# Patient Record
Sex: Male | Born: 1995 | Race: Black or African American | Hispanic: No | Marital: Single | Smoking: Current every day smoker
Health system: Southern US, Community
[De-identification: ages and names within clinical notes are randomized; demographics above are authoritative.]

---

## 2018-04-14 ENCOUNTER — Other Ambulatory Visit: Payer: Self-pay

## 2018-04-14 ENCOUNTER — Other Ambulatory Visit (HOSPITAL_COMMUNITY)
Admission: RE | Admit: 2018-04-14 | Discharge: 2018-04-14 | Disposition: A | Discharge status: Home / Self Care | Payer: PRIVATE HEALTH INSURANCE | Source: Ambulatory Visit | Attending: Family Medicine | Admitting: Family Medicine

## 2018-04-14 ENCOUNTER — Emergency Department (INDEPENDENT_AMBULATORY_CARE_PROVIDER_SITE_OTHER)
Admission: EM | Admit: 2018-04-14 | Discharge: 2018-04-14 | Disposition: A | Discharge status: Home / Self Care | Payer: PRIVATE HEALTH INSURANCE | Source: Home / Self Care

## 2018-04-14 ENCOUNTER — Encounter: Payer: Self-pay | Admitting: Emergency Medicine

## 2018-04-14 DIAGNOSIS — L980 Pyogenic granuloma: Secondary | ICD-10-CM | POA: Diagnosis not present

## 2018-04-14 DIAGNOSIS — J069 Acute upper respiratory infection, unspecified: Secondary | ICD-10-CM | POA: Diagnosis present

## 2018-04-14 DIAGNOSIS — L918 Other hypertrophic disorders of the skin: Secondary | ICD-10-CM

## 2018-04-14 DIAGNOSIS — Z87891 Personal history of nicotine dependence: Secondary | ICD-10-CM | POA: Insufficient documentation

## 2018-04-14 MED ORDER — DOXYCYCLINE HYCLATE 100 MG PO CAPS: 100.0000 mg | ORAL_CAPSULE | Freq: Two times a day (BID) | ORAL | 0 refills | Status: DC

## 2018-04-14 NOTE — Discharge Instructions (Signed)
Stay off work through tomorrow.  After tomorrow you can take the Band-Aid off and gently wash it daily.  This will stay a little inflamed and irritated for a few days, but if it is getting worse at any time please return for a recheck.  Take the doxycycline antibiotic 1 twice daily for possible infection.  Take caution because doxycycline can make you sunburn easier, so I would stay out of direct sun for long periods of time for the next week.  Return at any time if concerns that it is coming back or getting more infected or inflamed.  We have sent the specimen for pathology and will let you know the results of that.  Should you not hear from [place] within 2 weeks call back to check on the  report.

## 2018-04-14 NOTE — ED Triage Notes (Signed)
Skin problem on lower right testicle x a few months, red, painful, pain increases with friction from walking.

## 2018-04-14 NOTE — ED Provider Notes (Signed)
[facility]    CSN: [id number] Arrival date & time: 04/14/18  0917     History   Chief Complaint Chief Complaint  Patient presents with  . Skin Problem    HPI Mike Fox is a 22 y.o. male.  Over the past month the patient has noted a skin tag on the right side of his scrotum.  Over the last week or 2 it is gotten inflamed and swollen and painful.  His girlfriend actually first noticed it.  He is generally healthy and no other problems or lesions.  Also complains of an upper respiratory infection and did not feel like going to work because of it today so he came on in here to get both things taken care of. HPI  History reviewed. No pertinent past medical history.  There are no active problems to display for this patient.   History reviewed. No pertinent surgical history.     Home Medications    Prior to Admission medications   Medication Sig Start Date End Date Taking? Authorizing Provider  doxycycline (VIBRAMYCIN) 100 MG capsule Take 1 capsule (100 mg total) by mouth 2 (two) times daily. 04/14/18   Peyton Najjar, MD    Family History No family history on file.  Social History Social History   Tobacco Use  . Smoking status: Former Games developer  . Smokeless tobacco: Never Used  Substance Use Topics  . Alcohol use: Not Currently  . Drug use: Not Currently     Allergies   Patient has no known allergies.   Review of Systems Review of Systems Constitutional: Unremarkable HEENT: Has a URI, congestion, slight sore throat, slight cough.  No nodes. Respiratory: Slight cough Cardiovascular: Unremarkable Dermatologic: Skin tag as noted above  Physical Exam Triage Vital Signs ED Triage Vitals  Enc Vitals Group     BP 04/14/18 0944 121/81     Pulse Rate 04/14/18 0944 67     Resp --      Temp 04/14/18 0944 98.5 F (36.9 C)     Temp Source 04/14/18 0944 Oral     SpO2 04/14/18 0944 99 %     Weight 04/14/18 0946 269 lb (122 kg)     Height  04/14/18 0946  (1.905 m)     Head Circumference --      Peak Flow --      Pain Score 04/14/18 0945 5     Pain Loc --      Pain Edu? --      Excl. in GC? --    No data found.  Updated Vital Signs BP 121/81 (BP Location: Right Arm)   Pulse 67   Temp 98.5 F (36.9 C) (Oral)   Ht  (1.905 m)   Wt 269 lb (122 kg)   SpO2 99%   BMI 33.62 kg/m   Visual Acuity Right Eye Distance:   Left Eye Distance:   Bilateral Distance:    Right Eye Near:   Left Eye Near:    Bilateral Near:     Physical Exam  Alert and oriented. TMs normal except for dry skin.   Throat clear.  Neck supple without nodes.  Nose is runny. Chest clear to auscultation.  Heart regular without murmur. Inflamed swollen skin tag on the right side of the scrotum which is very tender, almost 1 cm long, 3 mm in diameter.  No redness or inflammation around the base. UC Treatments / Results  Labs (all labs ordered are  listed, but only abnormal results are displayed) Labs Reviewed - No data to display  EKG None  Radiology No results found.  Procedures Procedures (including critical care time) The lesion was numbed with 1% lidocaine with epinephrine after routine prep.  The inflamed swollen skin tag was clipped off and sent for pathology.  Silver nitrate was used to cauterize the base.  Patient tolerated procedure well. Medications Ordered in UC Medications - No data to display  Initial Impression / Assessment and Plan / UC Course  I have reviewed the triage vital signs and the nursing notes.  Pertinent labs & imaging results that were available during my care of the patient were reviewed by me and considered in my medical decision making (see chart for details).     Inflamed skin tag versus pyogenic granuloma or possible keratoacanthoma. Final Clinical Impressions(s) / UC Diagnoses   Final diagnoses:  Pyogenic granuloma  Skin tag of perineum in male  Acute upper respiratory infection      Discharge Instructions     Stay off work through tomorrow.  After tomorrow you can take the Band-Aid off and gently wash it daily.  This will stay a little inflamed and irritated for a few days, but if it is getting worse at any time please return for a recheck.  Take the doxycycline antibiotic 1 twice daily for possible infection.  Take caution because doxycycline can make you sunburn easier, so I would stay out of direct sun for long periods of time for the next week.  Return at any time if concerns that it is coming back or getting more infected or inflamed.  We have sent the specimen for pathology and will let you know the results of that.  Should you not hear from [place] within 2 weeks call back to check on the  report.    ED Prescriptions    Medication Sig Dispense Auth. Provider   doxycycline (VIBRAMYCIN) 100 MG capsule Take 1 capsule (100 mg total) by mouth 2 (two) times daily. 14 capsule Peyton Najjar, MD     Controlled Substance Prescriptions [place] Controlled Substance Registry consulted? No   Peyton Najjar, MD 04/14/18 1014

## 2018-05-26 ENCOUNTER — Other Ambulatory Visit: Payer: Self-pay

## 2018-05-26 ENCOUNTER — Emergency Department (INDEPENDENT_AMBULATORY_CARE_PROVIDER_SITE_OTHER)
Admission: EM | Admit: 2018-05-26 | Discharge: 2018-05-26 | Disposition: A | Discharge status: Home / Self Care | Payer: PRIVATE HEALTH INSURANCE | Source: Home / Self Care | Attending: Family Medicine | Admitting: Family Medicine

## 2018-05-26 DIAGNOSIS — K122 Cellulitis and abscess of mouth: Secondary | ICD-10-CM

## 2018-05-26 DIAGNOSIS — J029 Acute pharyngitis, unspecified: Secondary | ICD-10-CM | POA: Diagnosis not present

## 2018-05-26 LAB — POCT RAPID STREP A (OFFICE): Rapid Strep A Screen: NEGATIVE

## 2018-05-26 MED ORDER — PREDNISONE 20 MG PO TABS: ORAL_TABLET | ORAL | 0 refills | Status: DC

## 2018-05-26 MED ORDER — AMOXICILLIN 875 MG PO TABS: 875.0000 mg | ORAL_TABLET | Freq: Two times a day (BID) | ORAL | 0 refills | Status: DC

## 2018-05-26 NOTE — Discharge Instructions (Addendum)
Try Flonase nasal spray daily for nasal congestion. If symptoms return after about 2 weeks, try taking Pepcid AC at bedtime.  Avoid eating meals during the 2-3 hours before bedtime. Avoid lying down right after you eat.

## 2018-05-26 NOTE — ED Triage Notes (Signed)
Has been sore off and on for about 1 week

## 2018-05-26 NOTE — ED Provider Notes (Signed)
[facility]    CSN: [id number] Arrival date & time: 05/26/18  1255     History   Chief Complaint Chief Complaint  Patient presents with  . Sore Throat    HPI Mike Fox is a 22 y.o. male.   Patient complains of two week history of sore throat and swollen uvula, worse upon awakening each morning.  He notes that he always has nasal congestion at night, causing him to breath through his mouth at night.  No fevers, chills, and sweats.  No cough except in the morning when he awakens.  He often has the sensation each morning of having phlegm in his throat.  He denies heartburn and indigestion.  He admits that he often eats late and has a snack at bedtime.  The history is provided by the patient.    History reviewed. No pertinent past medical history.  There are no active problems to display for this patient.   History reviewed. No pertinent surgical history.     Home Medications    Prior to Admission medications   Medication Sig Start Date End Date Taking? Authorizing Provider  amoxicillin (AMOXIL) 875 MG tablet Take 1 tablet (875 mg total) by mouth 2 (two) times daily. 05/26/18   Lattie Haw, MD    predniSONE (DELTASONE) 20 MG tablet Take one tab by mouth twice daily for 4 days, then one daily for 3 days. Take with food. 05/26/18   Lattie Haw, MD    Family History History reviewed. No pertinent family history.  Social History Social History   Tobacco Use  . Smoking status: Former Games developer  . Smokeless tobacco: Never Used  Substance Use Topics  . Alcohol use: Yes  . Drug use: Not Currently     Allergies   Patient has no known allergies.   Review of Systems Review of Systems + sore throat + cough (in morning) No pleuritic pain No wheezing + nasal congestion ? post-nasal drainage No sinus pain/pressure No itchy/red eyes No earache No hemoptysis No SOB No fever/chills No nausea No vomiting No abdominal pain No diarrhea No urinary symptoms No skin rash No fatigue No myalgias No headache    Physical Exam Triage Vital Signs ED Triage Vitals  Enc Vitals Group     BP 05/26/18 1310 112/71     Pulse Rate 05/26/18 1310 (!) 58     Resp --      Temp 05/26/18 1310 98.6 F (37 C)     Temp Source 05/26/18 1310 Oral     SpO2 05/26/18 1310 99 %     Weight 05/26/18 1311 266 lb (120.7 kg)     Height 05/26/18 1311 6\' 4"  (1.93 m)     Head Circumference --      Peak Flow --      Pain Score 05/26/18 1311 0     Pain Loc --      Pain Edu? --      Excl. in GC? --    No data found.  Updated Vital Signs BP 112/71 (BP Location: Right Arm)   Pulse (!) 58   Temp 98.6 F (37 C) (Oral)   Ht 6\' 4"  (1.93 m)   Wt 266 lb (120.7 kg)   SpO2 99%   BMI 32.38 kg/m   Visual Acuity Right Eye Distance:   Left Eye Distance:   Bilateral Distance:    Right Eye Near:   Left Eye Near:    Bilateral Near:     Physical Exam Nursing notes and Vital Signs reviewed. Appearance:  Patient appears stated age, and in no acute distress Eyes:  Pupils are equal, round, and reactive to light and accomodation.  Extraocular movement is intact.  Conjunctivae are not inflamed  Ears:   Canals normal.  Tympanic membranes normal.  Nose:  Congested turbinates.  No sinus tenderness.    Pharynx:  Uvula mildly erythematous and edematous. Neck:  Supple.   No adenopathy Lungs:  Clear to auscultation.  Breath sounds are equal.  Moving air well. Heart:  Regular rate and rhythm without murmurs, rubs, or gallops.  Abdomen:  Nontender without masses or hepatosplenomegaly.  Bowel sounds are present.  No CVA or flank tenderness.  Extremities:  No edema.  Skin:  No rash present.  UC Treatments / Results  Labs (all labs ordered are listed, but only abnormal results are displayed) Labs Reviewed  STREP A DNA PROBE  POCT RAPID STREP A (OFFICE) negative    EKG None  Radiology No results found.  Procedures Procedures (including critical care time)  Medications Ordered in UC Medications - No data to display  Initial Impression / Assessment and Plan / UC Course  I have reviewed the triage vital signs and the nursing notes.  Pertinent labs & imaging results that were available during my care of the patient were reviewed by me and considered in my medical decision making (see chart for details).    Suspect acid reflux at night, combined with chronic nasal congestion.  Discussed reflux precautions. Begin empiric amoxicillin, and prednisone burst/taper. Followup with Family Doctor if not improved in about 3 weeks.   Final Clinical Impressions(s) / UC Diagnoses   Final diagnoses:  Sore throat  Uvulitis     Discharge Instructions     Try Flonase nasal spray daily for nasal congestion. If symptoms return after about 2 weeks, try taking Pepcid AC at bedtime.   Avoid eating meals during the 2-3 hours before bedtime.  Avoid lying down right after you eat.    ED Prescriptions    Medication Sig Dispense Auth. Provider   amoxicillin (AMOXIL) 875 MG tablet Take 1 tablet (875 mg total) by mouth 2 (two) times daily. 14 tablet Lattie Haw, MD   predniSONE (DELTASONE)  20 MG tablet Take one tab by mouth twice daily for 4 days, then one daily for 3 days. Take with food. 11 tablet Lattie Haw, MD         Lattie Haw, MD 05/26/18 [phone]

## 2018-05-27 ENCOUNTER — Telehealth: Payer: Self-pay

## 2018-05-27 LAB — STREP A DNA PROBE: GROUP A STREP PROBE: NOT DETECTED

## 2018-05-27 NOTE — Telephone Encounter (Signed)
Pt informed of neg strep cx results.

## 2018-05-28 ENCOUNTER — Telehealth: Payer: Self-pay | Admitting: Emergency Medicine

## 2018-05-28 NOTE — Telephone Encounter (Signed)
Contacted patient via telephone, no answer.Left a   Message that labs were normal. Also if he needs anything further he should return a call.

## 2018-07-22 ENCOUNTER — Emergency Department (INDEPENDENT_AMBULATORY_CARE_PROVIDER_SITE_OTHER)
Admission: EM | Admit: 2018-07-22 | Discharge: 2018-07-22 | Disposition: A | Discharge status: Home / Self Care | Payer: PRIVATE HEALTH INSURANCE | Source: Home / Self Care | Attending: Family Medicine | Admitting: Family Medicine

## 2018-07-22 ENCOUNTER — Other Ambulatory Visit: Payer: Self-pay

## 2018-07-22 DIAGNOSIS — M546 Pain in thoracic spine: Secondary | ICD-10-CM

## 2018-07-22 NOTE — ED Provider Notes (Signed)
[facility]    CSN: [id number] Arrival date & time: 07/22/18  1120     History   Chief Complaint Chief Complaint  Patient presents with  . Back Pain    HPI Mike Fox is a 22 y.o. male.   HPI  Mike Fox is a 22 y.o. male presenting to UC with c/o mid lower back pain over the last 3-4 days. Pain is aching and sore, worse with certain movements or in certain positions. 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 Pain resolves with ibuprofen. Pain is worse in the morning. This morning it was moderate severity but has resolved since then.  No medication taken today. No hx of back surgeries or severe injuries.    No past medical history on file.  There are no active problems to display for this patient.   No past surgical history on file.     Home Medications    Prior to Admission medications   Medication Sig Start Date End Date Taking? Authorizing Provider  amoxicillin (AMOXIL) 875 MG tablet Take 1 tablet (875 mg total) by mouth 2 (two) times daily. 05/26/18   Lattie Haw, MD  predniSONE (DELTASONE) 20 MG tablet Take one tab by mouth twice daily for 4 days, then one daily for 3 days. Take with food. 05/26/18   Lattie Haw, MD    Family History No family history on file.  Social History Social History   Tobacco Use  . Smoking status: Former Games developer  . Smokeless tobacco: Never Used  Substance Use Topics  . Alcohol use: Yes  . Drug use: Not Currently     Allergies   Patient has no known allergies.   Review of Systems Review of Systems  Constitutional: Negative for chills and fever.  Genitourinary: Negative for dysuria, flank pain, frequency and hematuria.  Musculoskeletal: Positive for back pain and myalgias.  Neurological: Negative for weakness and numbness.     Physical Exam Triage Vital Signs ED Triage Vitals  Enc Vitals Group     BP 07/22/18 1154 124/73     Pulse Rate  07/22/18 1154 68     Resp 07/22/18 1154 16     Temp 07/22/18 1154 98.4 F (36.9 C)     Temp Source 07/22/18 1154 Oral     SpO2 07/22/18 1154 99 %     Weight 07/22/18 1155 258 lb (117 kg)     Height 07/22/18 1155 6\' 4"  (1.93 m)     Head Circumference --      Peak Flow --      Pain Score 07/22/18 1155 4     Pain Loc --      Pain Edu? --      Excl. in GC? --    No data found.  Updated Vital Signs BP 124/73 (BP Location: Right Arm)   Pulse 68   Temp 98.4 F (36.9 C) (Oral)   Resp 16   Ht 6\' 4"  (1.93 m)   Wt 258 lb (117 kg)   SpO2 99%   BMI 31.40 kg/m   Visual Acuity Right Eye Distance:   Left Eye Distance:   Bilateral Distance:    Right Eye Near:   Left Eye Near:    Bilateral Near:     Physical Exam  Constitutional: He is oriented to person, place, and time. He appears well-developed and well-nourished. No distress.  HENT:  Head:  Normocephalic and atraumatic.  Eyes: EOM are normal.  Neck: Normal range of motion.  Cardiovascular: Normal rate and regular rhythm.  Pulmonary/Chest: Effort normal. No respiratory distress.  Musculoskeletal: Normal range of motion. He exhibits no edema or tenderness.  No spinal tenderness. No tenderness to back muscles. Full ROM upper and lower extremities bilaterally. Negative straight leg raise.  Neurological: He is alert and oriented to person, place, and time.  Skin: Skin is warm and dry. No rash noted. He is not diaphoretic. No erythema.  Psychiatric: He has a normal mood and affect. His behavior is normal.  Nursing note and vitals reviewed.    UC Treatments / Results  Labs (all labs ordered are listed, but only abnormal results are displayed) Labs Reviewed - No data to display  EKG None  Radiology No results found.  Procedures Procedures (including critical care time)  Medications Ordered in UC Medications - No data to display  Initial Impression / Assessment and Plan / UC Course  I have reviewed the triage vital  signs and the nursing notes.  Pertinent labs & imaging results that were available during my care of the patient were reviewed by me and considered in my medical decision making (see chart for details).     Hx c/w muscle strain. Normal exam Home care instructions provided.  Final Clinical Impressions(s) / UC Diagnoses   Final diagnoses:  Acute midline thoracic back pain     Discharge Instructions      Please call to schedule a follow up appointment with Sports Medicine in 1 week if not improving.  You may find additional information in this packet about ways to help prevent back pain.  You may take 500mg  acetaminophen every 4-6 hours or in combination with ibuprofen 400-600mg  every 6-8 hours as needed for pain and inflammation.  You may also alternate cool and warm compresses to help with muscle soreness.  Cool compresses after working out. Warm compresses to help relax sore muscles prior to working or in the evening.      ED Prescriptions    None     Controlled Substance Prescriptions [place] Controlled Substance Registry consulted? Not Applicable   Rolla Plate 07/22/18 1740

## 2018-07-22 NOTE — ED Triage Notes (Addendum)
Patient reports low back pain intermittently with certain positions for past 3-4 days; ibuprofen relieves it. Has had back problems in past since his job requires lifting heavy objects. Denies dysuria.

## 2018-07-22 NOTE — Discharge Instructions (Signed)
°  Please call to schedule a follow up appointment with Sports Medicine in 1 week if not improving.  You may find additional information in this packet about ways to help prevent back pain.  You may take 500mg  acetaminophen every 4-6 hours or in combination with ibuprofen 400-600mg  every 6-8 hours as needed for pain and inflammation.  You may also alternate cool and warm compresses to help with muscle soreness.  Cool compresses after working out. Warm compresses to help relax sore muscles prior to working or in the evening.

## 2018-09-14 ENCOUNTER — Encounter: Payer: Self-pay | Admitting: Emergency Medicine

## 2018-09-14 ENCOUNTER — Emergency Department (INDEPENDENT_AMBULATORY_CARE_PROVIDER_SITE_OTHER)
Admission: EM | Admit: 2018-09-14 | Discharge: 2018-09-14 | Disposition: A | Discharge status: Home / Self Care | Payer: PRIVATE HEALTH INSURANCE | Source: Home / Self Care | Attending: Family Medicine | Admitting: Family Medicine

## 2018-09-14 ENCOUNTER — Other Ambulatory Visit: Payer: Self-pay

## 2018-09-14 DIAGNOSIS — J029 Acute pharyngitis, unspecified: Secondary | ICD-10-CM | POA: Diagnosis not present

## 2018-09-14 DIAGNOSIS — J069 Acute upper respiratory infection, unspecified: Secondary | ICD-10-CM

## 2018-09-14 DIAGNOSIS — B9789 Other viral agents as the cause of diseases classified elsewhere: Secondary | ICD-10-CM

## 2018-09-14 LAB — POCT RAPID STREP A (OFFICE): Rapid Strep A Screen: NEGATIVE

## 2018-09-14 NOTE — ED Triage Notes (Signed)
Sore throat started yesterday

## 2018-09-14 NOTE — ED Provider Notes (Signed)
[facility]    CSN: [id number] Arrival date & time: 09/14/18  4098     History   Chief Complaint No chief complaint on file.   HPI Mike Fox is a 22 y.o. male.   Yesterday patient awoke with a sore throat that became worse last night.  Today he has developed a cough with fatigue, myalgias, sweats, hoarseness, and mild headache.  The history is provided by the patient.    History reviewed. No pertinent past medical history.  There are no active problems to display for this patient.   History reviewed. No pertinent surgical history.     Home Medications    Prior to Admission medications   Not on File    Family History History reviewed. No pertinent family history.  Social History Social History   Tobacco Use  . Smoking status: Former Games developer  . Smokeless tobacco: Never Used  Substance Use Topics  . Alcohol use: Yes  . Drug use: Not Currently     Allergies   Patient has no known allergies.   Review of Systems Review of Systems + sore throat + cough No pleuritic pain No wheezing + nasal congestion + post-nasal drainage No sinus pain/pressure No itchy/red eyes No earache No hemoptysis No SOB No fever, + chills/sweats No nausea No vomiting No abdominal pain No diarrhea No urinary symptoms No skin rash + fatigue + myalgias + headache Used OTC meds without relief   Physical Exam Triage Vital Signs ED Triage Vitals [09/14/18 0936]  Enc Vitals Group     BP 134/78     Pulse Rate 73     Resp      Temp 98.2 F (36.8 C)     Temp Source Oral     SpO2 98 %     Weight 258 lb (117 kg)     Height 6\' 4"  (1.93 m)     Head Circumference      Peak Flow      Pain Score 7     Pain Loc      Pain Edu?      Excl. in GC?    No data found.  Updated Vital Signs BP 134/78 (BP Location: Right Arm)   Pulse 73   Temp 98.2 F (36.8 C) (Oral)   Ht 6\' 4"  (1.93 m)   Wt 117 kg   SpO2 98%   BMI 31.40 kg/m   Visual Acuity Right  Eye Distance:   Left Eye Distance:   Bilateral Distance:    Right Eye Near:   Left Eye Near:    Bilateral Near:     Physical Exam Nursing notes and Vital Signs reviewed. Appearance:  Patient appears stated age, and in no acute distress Eyes:  Pupils are equal, round, and reactive to light and accomodation.  Extraocular movement is intact.  Conjunctivae are not inflamed  Ears:  Canals normal.  Tympanic membranes normal.  Nose:  Mildly congested turbinates.  No sinus tenderness.    Pharynx:  Normal Neck:  Supple.  Enlarged posterior/lateral nodes are palpated bilaterally, tender to palpation on the left.   Lungs:  Clear to auscultation.  Breath sounds are equal.  Moving air well. Heart:  Regular rate and rhythm without murmurs, rubs, or gallops.  Abdomen:  Nontender without masses or hepatosplenomegaly.  Bowel sounds are present.  No CVA or flank tenderness.  Extremities:  No edema.  Skin:  No rash present.    UC Treatments / Results  Labs (  all labs ordered are listed, but only abnormal results are displayed) Labs Reviewed  STREP A DNA PROBE  POCT RAPID STREP A (OFFICE) negative    EKG None  Radiology No results found.  Procedures Procedures (including critical care time)  Medications Ordered in UC Medications - No data to display  Initial Impression / Assessment and Plan / UC Course  I have reviewed the triage vital signs and the nursing notes.  Pertinent labs & imaging results that were available during my care of the patient were reviewed by me and considered in my medical decision making (see chart for details).    There is no evidence of bacterial infection today.  Treat symptomatically for now. Followup with Family Doctor if not improved in about 10 days.   Final Clinical Impressions(s) / UC Diagnoses   Final diagnoses:  Viral pharyngitis  Viral URI with cough     Discharge Instructions     As cold-like symptoms develop, try the following: Take plain  guaifenesin (1200mg  extended release tabs such as Mucinex) twice daily, with plenty of water, for cough and congestion.  May add Pseudoephedrine (30mg , one or two every 4 to 6 hours) for sinus congestion.  Get adequate rest.   May use Afrin nasal spray (or generic oxymetazoline) each morning for about 5 days and then discontinue.  Also recommend using saline nasal spray several times daily and saline nasal irrigation (AYR is a common brand).   Try warm salt water gargles for sore throat.  Stop all antihistamines for now, and other non-prescription cough/cold preparations. May take Ibuprofen 200mg , 4 tabs every 8 hours with food for sore throat, body aches, etc. May take Delsym Cough Suppressant at bedtime for nighttime cough.       ED Prescriptions    None         Lattie Haw, MD 09/14/18 1004

## 2018-09-14 NOTE — Discharge Instructions (Addendum)
As cold-like symptoms develop, try the following: Take plain guaifenesin (1200mg  extended release tabs such as Mucinex) twice daily, with plenty of water, for cough and congestion.  May add Pseudoephedrine (30mg , one or two every 4 to 6 hours) for sinus congestion.  Get adequate rest.   May use Afrin nasal spray (or generic oxymetazoline) each morning for about 5 days and then discontinue.  Also recommend using saline nasal spray several times daily and saline nasal irrigation (AYR is a common brand).   Try warm salt water gargles for sore throat.  Stop all antihistamines for now, and other non-prescription cough/cold preparations. May take Ibuprofen 200mg , 4 tabs every 8 hours with food for sore throat, body aches, etc. May take Delsym Cough Suppressant at bedtime for nighttime cough.

## 2018-09-15 ENCOUNTER — Telehealth: Payer: Self-pay

## 2018-09-15 LAB — STREP A DNA PROBE: GROUP A STREP PROBE: NOT DETECTED

## 2018-09-15 NOTE — Telephone Encounter (Signed)
Left message on VM that labs are negative and to call with any questions

## 2018-10-18 ENCOUNTER — Emergency Department (INDEPENDENT_AMBULATORY_CARE_PROVIDER_SITE_OTHER)
Admission: EM | Admit: 2018-10-18 | Discharge: 2018-10-18 | Disposition: A | Discharge status: Home / Self Care | Payer: PRIVATE HEALTH INSURANCE | Source: Home / Self Care | Attending: Family Medicine | Admitting: Family Medicine

## 2018-10-18 ENCOUNTER — Encounter: Payer: Self-pay | Admitting: *Deleted

## 2018-10-18 DIAGNOSIS — J029 Acute pharyngitis, unspecified: Secondary | ICD-10-CM

## 2018-10-18 DIAGNOSIS — J111 Influenza due to unidentified influenza virus with other respiratory manifestations: Secondary | ICD-10-CM

## 2018-10-18 DIAGNOSIS — R69 Illness, unspecified: Secondary | ICD-10-CM

## 2018-10-18 LAB — POCT RAPID STREP A (OFFICE): Rapid Strep A Screen: NEGATIVE

## 2018-10-18 MED ORDER — GUAIFENESIN-CODEINE 100-10 MG/5ML PO SOLN: ORAL | 0 refills | Status: DC

## 2018-10-18 MED ORDER — AZITHROMYCIN 250 MG PO TABS: ORAL_TABLET | ORAL | 0 refills | Status: DC

## 2018-10-18 NOTE — ED Provider Notes (Signed)
[facility]    CSN: [id number] Arrival date & time: 10/18/18  1009     History   Chief Complaint Chief Complaint  Patient presents with  . Sore Throat  . Nasal Congestion  . Generalized Body Aches    HPI Mike Fox is a 22 y.o. male.   Complains of 4 day history flu-like illness including myalgias, headache, chills, fatigue, but no cough.  Also has nasal congestion and sore throat.  He had a viral URI about one month ago and feels worse than he did then.  The history is provided by the patient.    No past medical history on file.  There are no active problems to display for this patient.   No past surgical history on file.     Home Medications    Prior to Admission medications   Medication Sig Start Date End Date Taking? Authorizing Provider  azithromycin (ZITHROMAX Z-PAK) 250 MG tablet Take 2 tabs today; then begin one tab once daily for 4 more days. (Rx void after 10/26/18) 10/18/18   Lattie Haw, MD  guaiFENesin-codeine 100-10 MG/5ML syrup Take 10mL by mouth at bedtime as needed for cough.  May repeat dose in 4 to 6 hours. 10/18/18   Lattie Haw, MD    Family History Family History  Problem Relation Age of Onset  . Healthy Mother   . Healthy Father     Social History Social History   Tobacco Use  . Smoking status: Former Games developer  . Smokeless tobacco: Never Used  Substance Use Topics  . Alcohol use: Yes  . Drug use: Not Currently     Allergies   Patient has no known allergies.   Review of Systems Review of Systems + sore throat No cough No pleuritic pain No wheezing + nasal congestion + post-nasal drainage No sinus pain/pressure No itchy/red eyes No earache No hemoptysis No SOB No fever, + chills No nausea No vomiting No abdominal pain No diarrhea No urinary symptoms No skin rash + fatigue + myalgias + headache Used OTC meds without relief   Physical Exam Triage Vital Signs ED Triage Vitals [10/18/18  1125]  Enc Vitals Group     BP (!) 148/84     Pulse Rate 90     Resp 16     Temp 98.7 F (37.1 C)     Temp Source Oral     SpO2 98 %     Weight 250 lb (113.4 kg)     Height 6\' 1"  (1.854 m)     Head Circumference      Peak Flow      Pain Score 0     Pain Loc      Pain Edu?      Excl. in GC?    No data found.  Updated Vital Signs BP (!) 148/84 (BP Location: Right Arm)   Pulse 90   Temp 98.7 F (37.1 C) (Oral)   Resp 16   Ht 6\' 1"  (1.854 m)   Wt 113.4 kg   SpO2 98%   BMI 32.98 kg/m   Visual Acuity Right Eye Distance:   Left Eye Distance:   Bilateral Distance:    Right Eye Near:   Left Eye Near:    Bilateral Near:     Physical Exam Nursing notes and Vital Signs reviewed. Appearance:  Patient appears stated age, and in no acute distress Eyes:  Pupils are equal, round, and reactive to light and accomodation.  Extraocular movement is intact.  Conjunctivae are not inflamed  Ears:  Canals normal.  Tympanic membranes normal.  Nose:  Mildly congested turbinates.  No sinus tenderness.   Pharynx:   Mild erythema Neck:  Supple.  Enlarged posterior/lateral nodes are palpated bilaterally, tender to palpation on the left.   Lungs:  Clear to auscultation.  Breath sounds are equal.  Moving air well. Heart:  Regular rate and rhythm without murmurs, rubs, or gallops.  Abdomen:  Nontender without masses or hepatosplenomegaly.  Bowel sounds are present.  No CVA or flank tenderness.  Extremities:  No edema.  Skin:  No rash present.    UC Treatments / Results  Labs (all labs ordered are listed, but only abnormal results are displayed) Labs Reviewed  STREP A DNA PROBE  POCT RAPID STREP A (OFFICE) negative    EKG None  Radiology No results found.  Procedures Procedures (including critical care time)  Medications Ordered in UC Medications - No data to display  Initial Impression / Assessment and Plan / UC Course  I have reviewed the triage vital signs and the nursing  notes.  Pertinent labs & imaging results that were available during my care of the patient were reviewed by me and considered in my medical decision making (see chart for details).    Although illness is flu-like, patient has missed window of opportunity to take Tamiflu. There is no evidence of bacterial infection today.  Treat symptomatically for now  Rx for Robitussin AC for night time cough.  Controlled Substance Prescriptions I have consulted the  Controlled Substances Registry for this patient, and feel the risk/benefit ratio today is favorable for proceeding with this prescription for a controlled substance.   Followup with Family Doctor if not improved in about 10 days.   Final Clinical Impressions(s) / UC Diagnoses   Final diagnoses:  Influenza-like illness     Discharge Instructions     Take plain guaifenesin (1200mg  extended release tabs such as Mucinex) twice daily, with plenty of water, for cough and congestion.  May add Pseudoephedrine (30mg , one or two every 4 to 6 hours) for sinus congestion.  Get adequate rest.   May use Afrin nasal spray (or generic oxymetazoline) each morning for about 5 days and then discontinue.  Also recommend using saline nasal spray several times daily and saline nasal irrigation (AYR is a common brand).   Try warm salt water gargles for sore throat.  Stop all antihistamines for now, and other non-prescription cough/cold preparations. May take Ibuprofen 200mg , 4 tabs every 8 hours with food for headache, body aches, fever, etc. Begin Azithromycin if not improving about one week or if persistent fever develops (Given a prescription to hold, with an expiration date)    Recommend a flu shot when well.     ED Prescriptions    Medication Sig Dispense Auth. Provider   guaiFENesin-codeine 100-10 MG/5ML syrup Take 10mL by mouth at bedtime as needed for cough.  May repeat dose in 4 to 6 hours. 100 mL Lattie HawBeese, Stephen A, MD   azithromycin (ZITHROMAX  Z-PAK) 250 MG tablet Take 2 tabs today; then begin one tab once daily for 4 more days. (Rx void after 10/26/18) 6 tablet Lattie HawBeese, Stephen A, MD        Lattie HawBeese, Stephen A, MD 10/20/18 1024

## 2018-10-18 NOTE — Discharge Instructions (Addendum)
Take plain guaifenesin (1200mg  extended release tabs such as Mucinex) twice daily, with plenty of water, for cough and congestion.  May add Pseudoephedrine (30mg , one or two every 4 to 6 hours) for sinus congestion.  Get adequate rest.   May use Afrin nasal spray (or generic oxymetazoline) each morning for about 5 days and then discontinue.  Also recommend using saline nasal spray several times daily and saline nasal irrigation (AYR is a common brand).   Try warm salt water gargles for sore throat.  Stop all antihistamines for now, and other non-prescription cough/cold preparations. May take Ibuprofen 200mg , 4 tabs every 8 hours with food for headache, body aches, fever, etc. Begin Azithromycin if not improving about one week or if persistent fever develops   Recommend a flu shot when well.

## 2018-10-18 NOTE — ED Triage Notes (Signed)
Sore throat, runny nose, body aches, x3 days.

## 2018-10-19 ENCOUNTER — Telehealth: Payer: Self-pay | Admitting: *Deleted

## 2018-10-19 LAB — STREP A DNA PROBE: GROUP A STREP PROBE: NOT DETECTED

## 2018-10-19 NOTE — Telephone Encounter (Signed)
Callback: No answer, LMOM f/u from visit. TCX negative. Call back as needed.

## 2019-06-21 ENCOUNTER — Other Ambulatory Visit: Payer: Self-pay

## 2019-06-21 ENCOUNTER — Emergency Department (INDEPENDENT_AMBULATORY_CARE_PROVIDER_SITE_OTHER)
Admission: EM | Admit: 2019-06-21 | Discharge: 2019-06-21 | Disposition: A | Discharge status: Home / Self Care | Payer: PRIVATE HEALTH INSURANCE | Source: Home / Self Care | Attending: Emergency Medicine | Admitting: Emergency Medicine

## 2019-06-21 DIAGNOSIS — S39012A Strain of muscle, fascia and tendon of lower back, initial encounter: Secondary | ICD-10-CM | POA: Diagnosis not present

## 2019-06-21 MED ORDER — IBUPROFEN 200 MG PO TABS: 600.0000 mg | ORAL_TABLET | Freq: Three times a day (TID) | ORAL | 0 refills | Status: DC | PRN

## 2019-06-21 NOTE — ED Provider Notes (Signed)
[facility]    CSN: [id number] Arrival date & time: 06/21/19  0808     History   Chief Complaint Chief Complaint  Patient presents with  . Back Pain    HPI Mike Fox is a 23 y.o. male.   The history is provided by the patient.  Back Pain Location:  Lumbar spine (He feels this may have started by bending or twisting repetitively about a week ago) Quality:  Aching Radiates to:  Does not radiate Pain severity now: Mild at rest, can be moderate with movement or twisting or bending. Onset quality:  Gradual Duration:  1 week Timing:  Constant Progression:  Waxing and waning Chronicity:  New (But had a lumbar strain 1 year ago that resolved without sequelae) Context: twisting   Context: not falling, not MVA and not recent illness   Relieved by: Rest.  Ibuprofen, took once, may have helped yesterday. Worsened by:  Movement, twisting and bending Associated symptoms: no abdominal pain, no bladder incontinence, no chest pain, no dysuria, no fever, no headaches, no leg pain, no numbness, no paresthesias, no pelvic pain, no perianal numbness, no tingling, no weakness and no weight loss   Risk factors: no hx of cancer, no hx of osteoporosis and no steroid use   Risk factors comment:  He admits he does not not stretch his muscles like he should.  He states he has tight hamstrings.  Lower back pain for about a week. Mid lower spinal area.  Woke up this am and bothering him.  Denies pain into the legs, denies injury.  History reviewed. No pertinent past medical history. In September 2019, seen in urgent care for acuteback pain/strain, which resolved without sequelae. Past medical history otherwise negative There are no active problems to display for this patient.   History reviewed. No pertinent surgical history.  Denies any surgical history.   Home Medications    Prior to Admission medications   Medication Sig Start Date End Date Taking? Authorizing Provider   ibuprofen (ADVIL) 200 MG tablet Take 3 tablets (600 mg total) by mouth every 8 (eight) hours as needed for mild pain or moderate pain. 06/21/19   Lajean ManesMassey, , MD    Family History Family History  Problem Relation Age of Onset  . Healthy Mother   . Healthy Father     Social History Social History   Tobacco Use  . Smoking status: Former Games developermoker  . Smokeless tobacco: Never Used  Substance Use Topics  . Alcohol use: Yes  . Drug use: Not Currently     Allergies   Patient has no known allergies.   Review of Systems Review of Systems  Constitutional: Negative for fever and weight loss.  Cardiovascular: Negative for chest pain.  Gastrointestinal: Negative for abdominal pain.  Genitourinary: Negative for bladder incontinence, dysuria and pelvic pain.  Musculoskeletal: Positive for back pain.  Neurological: Negative for tingling, weakness, numbness, headaches and paresthesias.  All other systems reviewed and are negative.       Triage Vital Signs ED Triage Vitals  Enc Vitals Group     BP      Pulse      Resp      Temp      Temp src      SpO2      Weight      Height      Head Circumference      Peak Flow      Pain Score  Pain Loc      Pain Edu?      Excl. in [place]?    No data found.  Updated Vital Signs BP 127/70 (BP Location: Right Arm)   Pulse (!) 56   Temp 98.4 F (36.9 C) (Oral)   Resp 20   Ht 6\' 4"  (1.93 m)   Wt 113.4 kg   SpO2 98%   BMI 30.43 kg/m   Physical Exam Vitals signs and nursing note reviewed.  Constitutional:      General: He is no acute distress (Appears uncomfortable from low back pain.).     Appearance: He is well-developed. He is not toxic-appearing.  HENT:     Head: Normocephalic and atraumatic.  Eyes:     General: No scleral icterus.    Pupils: Pupils are equal, round, and reactive to light.  Neck:     Musculoskeletal: Neck supple.  Cardiovascular:     Rate and Rhythm: Regular rhythm.     Heart sounds: Normal heart  sounds.  Pulmonary:     Effort: Pulmonary effort is normal. No respiratory distress.     Breath sounds: Normal breath sounds. No wheezing or rales.  Chest:     Chest wall: No tenderness.  Abdominal:     Palpations: Abdomen is soft.     Tenderness: There is no abdominal tenderness.  Musculoskeletal:     Right hip: Normal.     Left hip: Normal.     Cervical back: He exhibits no tenderness.     Thoracic back: He exhibits no tenderness.     Lumbar back: He exhibits moderate decreased range of motion, mild tenderness and mild spasm.  Pain exacerbated by flexion and torsion of back.   He exhibits no swelling, no edema, no deformity, no laceration and normal pulse.     Comments:  Negative Right straight leg-raise test. Negative Left straight leg-raise test.  Negative Right Patrick test. Negative Left Saralyn Pilar test.  He has tight hamstrings.  Skin:    General: Skin is warm and dry.     Findings: No lesion or rash.  Neurological:     Mental Status: He is alert and oriented to person, place, and time.     Cranial Nerves: No cranial nerve deficit.     Sensory: No sensory deficit.     Motor: No tremor, atrophy or abnormal muscle tone.     Gait: Gait normal.     Deep Tendon Reflexes: Reflexes normal.     Reflex Scores:      Patellar reflexes are 2+ on the right side and 2+ on the left side.      Achilles reflexes are 2+ on the right side and 2+ on the left side. Psychiatric:        Behavior: Behavior is cooperative.     UC Treatments / Results  Labs (all labs ordered are listed, but only abnormal results are displayed) Labs Reviewed - No data to display  EKG   Radiology No results found.  Procedures Procedures (including critical care time)  Medications Ordered in UC Medications - No data to display  Initial Impression / Assessment and Plan / UC Course  I have reviewed the triage vital signs and the nursing notes.  Pertinent labs & imaging results that were available  during my care of the patient were reviewed by me and considered in my medical decision making (see chart for details).      Final Clinical Impressions(s) / UC Diagnoses   Final  diagnoses:  Strain of lumbar region, initial encounter     Discharge Instructions     Based on history and physical exam, you likely have a low back/lumbar strain.  No evidence of spinal involvement or any pinched nerve.  X-ray/imaging is not indicated at this time. Please read attached instruction sheet on lumbar strain. Home treatment: Alternate heat and ice.  Over-the-counter ibuprofen, can take up to 3 tablets 3 times a day with food for a week. If no better in 1 week, follow-up [facility] sports medicine[facility]. I am printing a note excusing from work today, may return to work tomorrow, but no lifting over 25 pounds for 7 days.    ED Prescriptions    Medication Sig Dispense Auth. Provider   ibuprofen (ADVIL) 200 MG tablet Take 3 tablets (600 mg total) by mouth every 8 (eight) hours as needed for mild pain or moderate pain. 30 tablet Lajean ManesMassey, , MD    We also discussed proper stretching techniques. Red flags discussed, discussed what to do and where to seek medical care if any red flags.  Precautions discussed. He voiced understanding.  Controlled Substance Prescriptions [place] Controlled Substance Registry consulted? Not Applicable   Lajean ManesMassey, , MD 06/21/19 [phone]

## 2019-06-21 NOTE — Discharge Instructions (Addendum)
Based on history and physical exam, you likely have a low back/lumbar strain.  No evidence of spinal involvement or any pinched nerve.  X-ray/imaging is not indicated at this time. Please read attached instruction sheet on lumbar strain. Home treatment: Alternate heat and ice.  Over-the-counter ibuprofen, can take up to 3 tablets 3 times a day with food for a week. If no better in 1 week, follow-up with sports medicine/primary care office here in [facility]. I am printing a note excusing from work today, may return to work tomorrow, but no lifting over 25 pounds for 7 days.

## 2019-06-21 NOTE — ED Triage Notes (Signed)
Lower back pain for about a week. Mid lower spinal area.  Woke up this am and bothering him.  Denies pain into the legs, denies injury.

## 2019-09-21 ENCOUNTER — Other Ambulatory Visit: Payer: Self-pay

## 2019-09-21 DIAGNOSIS — Z20822 Contact with and (suspected) exposure to covid-19: Secondary | ICD-10-CM

## 2019-09-23 LAB — NOVEL CORONAVIRUS, NAA: SARS-CoV-2, NAA: NOT DETECTED

## 2019-09-26 ENCOUNTER — Emergency Department (INDEPENDENT_AMBULATORY_CARE_PROVIDER_SITE_OTHER)
Admission: EM | Admit: 2019-09-26 | Discharge: 2019-09-26 | Disposition: A | Discharge status: Home / Self Care | Payer: PRIVATE HEALTH INSURANCE | Source: Home / Self Care

## 2019-09-26 ENCOUNTER — Other Ambulatory Visit: Payer: Self-pay

## 2019-09-26 DIAGNOSIS — R509 Fever, unspecified: Secondary | ICD-10-CM | POA: Diagnosis not present

## 2019-09-26 DIAGNOSIS — J069 Acute upper respiratory infection, unspecified: Secondary | ICD-10-CM | POA: Diagnosis not present

## 2019-09-26 NOTE — Discharge Instructions (Signed)
Use daytime allergy medication as well as drinking more water as discussed. Your COVID test is pending - it is important to quarantine / isolate at home until your results are back. If you test positive and would like further evaluation for persistent or worsening symptoms, you may schedule an E-visit or virtual (video) visit throughout the [facility] app or website.  PLEASE NOTE: If you develop severe chest pain or shortness of breath please go to the ER or call 9-1-1 for further evaluation --> DO NOT schedule electronic or virtual visits for this. Please call our office for further guidance / recommendations as needed.

## 2019-09-26 NOTE — ED Provider Notes (Signed)
[facility]    CSN: [id number] Arrival date & time: 09/26/19  1048      History   Chief Complaint Chief Complaint  Patient presents with  . Fever  . Headache    HPI Mike Fox is a 23 y.o. male presenting for 1 week course of subjective fever, mild frontal headache.  Headache is intermittent, alleviated with OTC medications.  Patient has been taking NyQuil for nasal congestion, subjective fever with adequate relief of symptoms.  Patient does not have thermometer at home: Denies known aggravating, alleviating factors for fever.  Of note, patient underwent Covid testing on 11/5 (2 days after symptom onset): Negative.  Patient works: States some people of tested positive, though he has not been in close contact with them.   History reviewed. No pertinent past medical history.  There are no active problems to display for this patient.   History reviewed. No pertinent surgical history.     Home Medications    Prior to Admission medications   Medication Sig Start Date End Date Taking? Authorizing Provider  ibuprofen (ADVIL) 200 MG tablet Take 3 tablets (600 mg total) by mouth every 8 (eight) hours as needed for mild pain or moderate pain. 06/21/19   Lajean Manes, MD    Family History Family History  Problem Relation Age of Onset  . Healthy Mother   . Healthy Father     Social History Social History   Tobacco Use  . Smoking status: Former Games developer  . Smokeless tobacco: Never Used  Substance Use Topics  . Alcohol use: Yes  . Drug use: Not Currently     Allergies   Patient has no known allergies.   Review of Systems Review of Systems  Constitutional: Positive for fever. Negative for fatigue.       Subjective, unknown T-max  HENT: Positive for congestion. Negative for dental problem, drooling, ear pain, facial swelling, hearing loss, postnasal drip, sinus pressure, sinus pain, sneezing, sore throat, tinnitus, trouble swallowing and voice change.    Eyes: Negative for photophobia, pain, discharge, redness, itching and visual disturbance.  Respiratory: Negative for cough, shortness of breath and wheezing.   Cardiovascular: Negative for chest pain and palpitations.  Gastrointestinal: Negative for abdominal pain, blood in stool, diarrhea, nausea and vomiting.  Genitourinary: Negative for frequency, penile pain, testicular pain and urgency.  Musculoskeletal: Negative for arthralgias and myalgias.  Neurological: Positive for headaches. Negative for dizziness, tremors, weakness and light-headedness.       No active headache in office today, worse when bending over to pick up heavy items     Physical Exam Triage Vital Signs ED Triage Vitals  Enc Vitals Group     BP      Pulse      Resp      Temp      Temp src      SpO2      Weight      Height      Head Circumference      Peak Flow      Pain Score      Pain Loc      Pain Edu?      Excl. in [place]?    No data found.  Updated Vital Signs BP 132/81 (BP Location: Left Arm)   Pulse 78   Temp 98.4 F (36.9 C) (Oral)   Resp 18   SpO2 99%   Visual Acuity Right Eye Distance:   Left Eye Distance:   Bilateral  Distance:    Right Eye Near:   Left Eye Near:    Bilateral Near:     Physical Exam Constitutional:      General: He is not in acute distress.    Appearance: He is obese. He is not ill-appearing.  HENT:     Head: Normocephalic and atraumatic.     Right Ear: Tympanic membrane, ear canal and external ear normal.     Left Ear: Tympanic membrane, ear canal and external ear normal.     Nose: No nasal deformity, congestion or rhinorrhea.     Comments: Turbinates nonedematous bilaterally with pink mucosa    Mouth/Throat:     Mouth: Mucous membranes are moist.     Tongue: Tongue does not deviate from midline.     Pharynx: Oropharynx is clear. Uvula midline.     Comments: No tonsillar hypertrophy or exudate Eyes:     General: No scleral icterus.    Conjunctiva/sclera:  Conjunctivae normal.     Pupils: Pupils are equal, round, and reactive to light.  Neck:     Musculoskeletal: Normal range of motion and neck supple. No muscular tenderness.  Cardiovascular:     Rate and Rhythm: Normal rate and regular rhythm.     Heart sounds: No murmur. No gallop.   Pulmonary:     Effort: Pulmonary effort is normal. No respiratory distress.     Breath sounds: No wheezing.  Lymphadenopathy:     Cervical: No cervical adenopathy.  Skin:    General: Skin is warm.     Capillary Refill: Capillary refill takes less than 2 seconds.     Coloration: Skin is not cyanotic.     Findings: No rash.  Neurological:     Mental Status: He is alert.     Cranial Nerves: No cranial nerve deficit, dysarthria or facial asymmetry.     Sensory: No sensory deficit.     Motor: No weakness.     Coordination: Romberg sign negative. Coordination normal.     Gait: Gait normal.     Comments: Negative Brudzinski's      UC Treatments / Results  Labs (all labs ordered are listed, but only abnormal results are displayed) Labs Reviewed  NOVEL CORONAVIRUS, NAA    EKG   Radiology No results found.  Procedures Procedures (including critical care time)  Medications Ordered in UC Medications - No data to display  Initial Impression / Assessment and Plan / UC Course  I have reviewed the triage vital signs and the nursing notes.  Pertinent labs & imaging results that were available during my care of the patient were reviewed by me and considered in my medical decision making (see chart for details).     H&P consistent without viral URI without cough.  Will continue supportive management at home.  Due to 2-day course of symptoms at time of previous Covid testing, repeat PCR done in office today which patient tolerated well.  Patient to quarantine until his results are back.  Regarding headache, no neurocognitive deficits on exam.  Seems to be more exertional without signs of orthostasis.   Patient to keep log of symptoms, avoid heavy lifting for a few days.  Would expect this to resolve once reported fever subsides.  Stressed importance of getting thermometer to monitor temperature.  Return precautions discussed, patient verbalized understanding and is agreeable to plan. Final Clinical Impressions(s) / UC Diagnoses   Final diagnoses:  Fever, unspecified  Viral URI     Discharge Instructions  Use daytime allergy medication as well as drinking more water as discussed. Your COVID test is pending - it is important to quarantine / isolate at home until your results are back. If you test positive and would like further evaluation for persistent or worsening symptoms, you may schedule an E-visit or virtual (video) visit throughout the [facility] MyChart app or website.  PLEASE NOTE: If you develop severe chest pain or shortness of breath please go to the ER or call 9-1-1 for further evaluation --> DO NOT schedule electronic or virtual visits for this. Please call our office for further guidance / recommendations as needed.    ED Prescriptions    None     PDMP not reviewed this encounter.   Hall-Potvin, [place], [place]-C 09/26/19 1138

## 2019-09-26 NOTE — ED Triage Notes (Signed)
Pt c/o fever and headache x 1 week. Was tested for covid on 11/5. Neg result 11/7. Didn't have a way to take temperature but feels like hes had a temp. Took fever reducer at 820 this morning.

## 2019-09-28 LAB — NOVEL CORONAVIRUS, NAA: SARS-CoV-2, NAA: NOT DETECTED

## 2019-11-28 ENCOUNTER — Encounter: Payer: Self-pay | Admitting: Emergency Medicine

## 2019-11-28 ENCOUNTER — Other Ambulatory Visit: Payer: Self-pay

## 2019-11-28 ENCOUNTER — Emergency Department (INDEPENDENT_AMBULATORY_CARE_PROVIDER_SITE_OTHER)
Admission: EM | Admit: 2019-11-28 | Discharge: 2019-11-28 | Disposition: A | Discharge status: Home / Self Care | Payer: No Typology Code available for payment source | Source: Home / Self Care | Attending: Family Medicine | Admitting: Family Medicine

## 2019-11-28 DIAGNOSIS — J029 Acute pharyngitis, unspecified: Secondary | ICD-10-CM | POA: Diagnosis not present

## 2019-11-28 DIAGNOSIS — J069 Acute upper respiratory infection, unspecified: Secondary | ICD-10-CM

## 2019-11-28 LAB — POCT RAPID STREP A (OFFICE): Rapid Strep A Screen: NEGATIVE

## 2019-11-28 MED ORDER — PREDNISONE 20 MG PO TABS: ORAL_TABLET | ORAL | 0 refills | Status: DC

## 2019-11-28 NOTE — ED Provider Notes (Signed)
[facility]    CSN: [id number] Arrival date & time: 11/28/19  1433      History   Chief Complaint Chief Complaint  Patient presents with  . Sore Throat  . Nasal Congestion    HPI Mike Fox is a 24 y.o. male.   Patient complains of five day history of typical cold-like symptoms developing over several days, including mild sore throat, sinus congestion, headache, fatigue, and sweats.  He denies chest tightness, shortness of breath, and changes in taste/smell.     The history is provided by the patient.    History reviewed. No pertinent past medical history.  There are no problems to display for this patient.   History reviewed. No pertinent surgical history.     Home Medications    Prior to Admission medications   Medication Sig Start Date End Date Taking? Authorizing Provider  ibuprofen (ADVIL) 200 MG tablet Take 3 tablets (600 mg total) by mouth every 8 (eight) hours as needed for mild pain or moderate pain. 06/21/19   Jacqulyn Cane, MD  predniSONE (DELTASONE) 20 MG tablet Take one tab by mouth twice daily for 4 days, then one daily. Take with food. 11/28/19   Kandra Nicolas, MD    Family History Family History  Problem Relation Age of Onset  . Healthy Mother   . Healthy Father     Social History Social History   Tobacco Use  . Smoking status: Former Research scientist (life sciences)  . Smokeless tobacco: Never Used  Substance Use Topics  . Alcohol use: Yes  . Drug use: Not Currently     Allergies   Patient has no known allergies.   Review of Systems Review of Systems + sore throat No cough No pleuritic pain No wheezing + nasal congestion + post-nasal drainage No sinus pain/pressure No itchy/red eyes No earache No hemoptysis No SOB No fever, + chills/sweats No nausea No vomiting No abdominal pain No diarrhea No urinary symptoms No skin rash + fatigue + myalgias + headache Used OTC meds (Nyquil) without relief   Physical Exam Triage  Vital Signs ED Triage Vitals  Enc Vitals Group     BP 11/28/19 1540 123/76     Pulse Rate 11/28/19 1540 90     Resp 11/28/19 1540 18     Temp 11/28/19 1540 98.4 F (36.9 C)     Temp Source 11/28/19 1540 Oral     SpO2 11/28/19 1540 98 %     Weight 11/28/19 1545 245 lb (111.1 kg)     Height 11/28/19 1545 6\' 4"  (1.93 m)     Head Circumference --      Peak Flow --      Pain Score 11/28/19 1544 4     Pain Loc --      Pain Edu? --      Excl. in [place]? --    No data found.  Updated Vital Signs BP 123/76 (BP Location: Right Arm)   Pulse 90   Temp 98.4 F (36.9 C) (Oral)   Resp 18   Ht 6\' 4"  (1.93 m)   Wt 111.1 kg   SpO2 98%   BMI 29.82 kg/m   Visual Acuity Right Eye Distance:   Left Eye Distance:   Bilateral Distance:    Right Eye Near:   Left Eye Near:    Bilateral Near:     Physical Exam Nursing notes and Vital Signs reviewed. Appearance:  Patient appears stated age, and in no acute distress  Eyes:  Pupils are equal, round, and reactive to light and accomodation.  Extraocular movement is intact.  Conjunctivae are not inflamed  Ears:  Canals normal.  Tympanic membranes normal.  Nose:  Mildly congested turbinates.  No sinus tenderness. Pharynx:  Erythematous tonsils Neck:  Supple.  Mildly enlarged lateral nodes are present, tender to palpation on the left.   Lungs:  Clear to auscultation.  Breath sounds are equal.  Moving air well. Heart:  Regular rate and rhythm without murmurs, rubs, or gallops.  Abdomen:  Nontender without masses or hepatosplenomegaly.  Bowel sounds are present.  No CVA or flank tenderness.  Extremities:  No edema.  Skin:  No rash present.   UC Treatments / Results  Labs (all labs ordered are listed, but only abnormal results are displayed) Labs Reviewed  STREP A DNA PROBE  POCT RAPID STREP A (OFFICE) negative    EKG   Radiology No results found.  Procedures Procedures (including critical care time)  Medications Ordered in  UC Medications - No data to display  Initial Impression / Assessment and Plan / UC Course  I have reviewed the triage vital signs and the nursing notes.  Pertinent labs & imaging results that were available during my care of the patient were reviewed by me and considered in my medical decision making (see chart for details).    There is no evidence of bacterial infection today.  Treat symptomatically for now. Begin prednisone burst/taper.  Begin prednisone burst/taper. Followup with Family Doctor if not improved in about 10 days.   Final Clinical Impressions(s) / UC Diagnoses   Final diagnoses:  Acute pharyngitis, unspecified etiology  Viral URI with cough     Discharge Instructions     Take plain guaifenesin (1200mg  extended release tabs such as Mucinex) twice daily, with plenty of water, for cough and congestion.  May add Pseudoephedrine (30mg , one or two every 4 to 6 hours) for sinus congestion.  Get adequate rest.   May use Afrin nasal spray (or generic oxymetazoline) each morning for about 5 days and then discontinue.  Also recommend using saline nasal spray several times daily and saline nasal irrigation (AYR is a common brand).  Use Flonase nasal spray each morning after using Afrin nasal spray and saline nasal irrigation. Try warm salt water gargles for sore throat.  Stop all antihistamines for now, and other non-prescription cough/cold preparations. May take Tylenol as needed for pain, fever, etc. May take Delsym Cough Suppressant at bedtime for nighttime cough.     ED Prescriptions    Medication Sig Dispense Auth. Provider   predniSONE (DELTASONE) 20 MG tablet Take one tab by mouth twice daily for 4 days, then one daily. Take with food. 12 tablet , MD        , MD 11/30/19 [phone]

## 2019-11-28 NOTE — ED Triage Notes (Signed)
Reports sore throat and some congestion for past 5 days. No new exposure to covid positive person; has been tested x 2 over past 3 months. Denies fever. Has not had influenza vacc this season.

## 2019-11-28 NOTE — Discharge Instructions (Addendum)
Take plain guaifenesin (1200mg extended release tabs such as Mucinex) twice daily, with plenty of water, for cough and congestion.  May add Pseudoephedrine (30mg, one or two every 4 to 6 hours) for sinus congestion.  Get adequate rest.   May use Afrin nasal spray (or generic oxymetazoline) each morning for about 5 days and then discontinue.  Also recommend using saline nasal spray several times daily and saline nasal irrigation (AYR is a common brand).  Use Flonase nasal spray each morning after using Afrin nasal spray and saline nasal irrigation. Try warm salt water gargles for sore throat.  Stop all antihistamines for now, and other non-prescription cough/cold preparations. May take Tylenol as needed for pain, fever, etc. May take Delsym Cough Suppressant at bedtime for nighttime cough.  

## 2019-11-29 LAB — STREP A DNA PROBE: Group A Strep Probe: NOT DETECTED

## 2020-02-19 ENCOUNTER — Other Ambulatory Visit: Payer: Self-pay

## 2020-02-19 ENCOUNTER — Emergency Department (INDEPENDENT_AMBULATORY_CARE_PROVIDER_SITE_OTHER)
Admission: EM | Admit: 2020-02-19 | Discharge: 2020-02-19 | Disposition: A | Discharge status: Home / Self Care | Payer: No Typology Code available for payment source | Source: Home / Self Care | Attending: Family Medicine | Admitting: Family Medicine

## 2020-02-19 ENCOUNTER — Emergency Department (INDEPENDENT_AMBULATORY_CARE_PROVIDER_SITE_OTHER): Payer: No Typology Code available for payment source

## 2020-02-19 DIAGNOSIS — S29011A Strain of muscle and tendon of front wall of thorax, initial encounter: Secondary | ICD-10-CM

## 2020-02-19 DIAGNOSIS — R079 Chest pain, unspecified: Secondary | ICD-10-CM

## 2020-02-19 NOTE — ED Provider Notes (Signed)
[facility]    CSN: [id number] Arrival date & time: 02/19/20  1142      History   Chief Complaint No chief complaint on file.   HPI Lumir Buckles is a 24 y.o. male.   While working about a week ago, patient noticed vague intermittent pain in his mid-sternal area, worse with movement.  His job involves heavy lifting but he recalls no injury.  He denies pleuritic pain or shortness of breath.  He denies recent URI and fevers, chills, and sweats    Pt c/o intermittent chest pain x 1 week. Started feeling it when he was working. Mentions doing heavy lifting.  The history is provided by the patient.  Chest Pain Pain location:  Substernal area Pain quality: aching   Pain radiates to:  Does not radiate Pain severity:  Mild Onset quality:  Sudden Duration:  1 week Timing:  Intermittent Progression:  Unchanged Chronicity:  New Context: breathing, lifting, movement and at rest   Context: not trauma   Relieved by:  Certain positions Worsened by:  Coughing, deep breathing and movement Ineffective treatments:  None tried Associated symptoms: no abdominal pain, no AICD problem, no cough, no diaphoresis, no dysphagia, no fatigue, no fever, no heartburn, no nausea, no palpitations, no shortness of breath and no vomiting   Risk factors: obesity     History reviewed. No pertinent past medical history.  There are no problems to display for this patient.   History reviewed. No pertinent surgical history.     Home Medications    Prior to Admission medications   Medication Sig Start Date End Date Taking? Authorizing Provider  ibuprofen (ADVIL) 200 MG tablet Take 3 tablets (600 mg total) by mouth every 8 (eight) hours as needed for mild pain or moderate pain. 06/21/19   Jacqulyn Cane, MD  predniSONE (DELTASONE) 20 MG tablet Take one tab by mouth twice daily for 4 days, then one daily. Take with food. 11/28/19   Kandra Nicolas, MD    Family History Family History    Problem Relation Age of Onset  . Healthy Mother   . Healthy Father     Social History Social History   Tobacco Use  . Smoking status: Former Research scientist (life sciences)  . Smokeless tobacco: Never Used  Substance Use Topics  . Alcohol use: Yes  . Drug use: Not Currently     Allergies   Patient has no known allergies.   Review of Systems Review of Systems  Constitutional: Negative for diaphoresis, fatigue and fever.  HENT: Negative for trouble swallowing.   Respiratory: Negative for cough and shortness of breath.   Cardiovascular: Positive for chest pain. Negative for palpitations.  Gastrointestinal: Negative for abdominal pain, heartburn, nausea and vomiting.  All other systems reviewed and are negative.    Physical Exam Triage Vital Signs ED Triage Vitals  Enc Vitals Group     BP 02/19/20 1201 131/74     Pulse Rate 02/19/20 1201 71     Resp 02/19/20 1201 18     Temp 02/19/20 1201 98.7 F (37.1 C)     Temp Source 02/19/20 1201 Oral     SpO2 02/19/20 1201 98 %     Weight 02/19/20 1202 255 lb (115.7 kg)     Height 02/19/20 1202 6\' 4"  (1.93 m)     Head Circumference --      Peak Flow --      Pain Score 02/19/20 1202 4     Pain Loc --  Pain Edu? --      Excl. in [place]? --    No data found.  Updated Vital Signs BP 131/74 (BP Location: Right Arm)   Pulse 71   Temp 98.7 F (37.1 C) (Oral)   Resp 18   Ht 6\' 4"  (1.93 m)   Wt 115.7 kg   SpO2 98%   BMI 31.04 kg/m   Visual Acuity Right Eye Distance:   Left Eye Distance:   Bilateral Distance:    Right Eye Near:   Left Eye Near:    Bilateral Near:     Physical Exam Vitals and nursing note reviewed.  Constitutional:      General: He is not in acute distress. HENT:     Head: Normocephalic.     Nose: Nose normal.     Mouth/Throat:     Pharynx: Oropharynx is clear.  Eyes:     Pupils: Pupils are equal, round, and reactive to light.  Cardiovascular:     Rate and Rhythm: Normal rate.     Heart sounds: Normal heart  sounds.  Pulmonary:     Breath sounds: Normal breath sounds.  Chest:     Chest wall: Tenderness present.       Comments: Tenderness over sternum and pectoralis muscles, worse while palpating the pectoralis muscles during resisted flexion. Abdominal:     Tenderness: There is no abdominal tenderness.  Musculoskeletal:        General: No tenderness.     Cervical back: Normal range of motion.     Right lower leg: No edema.     Left lower leg: No edema.  Skin:    General: Skin is warm and dry.     Findings: No rash.  Neurological:     Mental Status: He is alert.      UC Treatments / Results  Labs (all labs ordered are listed, but only abnormal results are displayed) Labs Reviewed - No data to display  EKG   Radiology DG Chest 2 View  Result Date: 02/19/2020 CLINICAL DATA:  Chest pain over the last week.  Smoking history. EXAM: CHEST - 2 VIEW COMPARISON:  None. FINDINGS: Heart size is normal. Mediastinal shadows are normal. The lungs are clear. No bronchial thickening. No infiltrate, mass, effusion or collapse. Pulmonary vascularity is normal. No bony abnormality. IMPRESSION: Normal chest Electronically Signed   By: 04/20/2020 M.D.   On: 02/19/2020 13:42    Procedures Procedures (including critical care time)  Medications Ordered in UC Medications - No data to display  Initial Impression / Assessment and Plan / UC Course  I have reviewed the triage vital signs and the nursing notes.  Pertinent labs & imaging results that were available during my care of the patient were reviewed by me and considered in my medical decision making (see chart for details).    Followup with Dr. 04/20/2020 ([facility]) if not improving about two weeks.   Final Clinical Impressions(s) / UC Diagnoses   Final diagnoses:  Pectoralis muscle strain, initial encounter     Discharge Instructions     Apply ice pack for 20 to 30 minutes, 3 to 4 times daily  Continue until  pain and swelling decrease.  May take Ibuprofen 200mg , 4 tabs every 8 hours with food.  Decrease weight-lifting activities until improved, then gradually resume.    ED Prescriptions    None        Rodney Langton, MD 02/21/20 2127

## 2020-02-19 NOTE — Discharge Instructions (Addendum)
Apply ice pack for 20 to 30 minutes, 3 to 4 times daily  Continue until pain and swelling decrease.  May take Ibuprofen 200mg , 4 tabs every 8 hours with food.  Decrease weight-lifting activities until improved, then gradually resume.

## 2020-02-19 NOTE — ED Triage Notes (Signed)
Pt c/o intermittent chest pain x 1 week. Started feeling it when he was working. Mentions doing heavy lifting.

## 2020-04-08 ENCOUNTER — Emergency Department (INDEPENDENT_AMBULATORY_CARE_PROVIDER_SITE_OTHER)
Admission: EM | Admit: 2020-04-08 | Discharge: 2020-04-08 | Disposition: A | Discharge status: Home / Self Care | Payer: BC Managed Care – PPO | Source: Home / Self Care

## 2020-04-08 ENCOUNTER — Other Ambulatory Visit: Payer: Self-pay

## 2020-04-08 ENCOUNTER — Encounter: Payer: Self-pay | Admitting: Emergency Medicine

## 2020-04-08 DIAGNOSIS — J039 Acute tonsillitis, unspecified: Secondary | ICD-10-CM | POA: Diagnosis not present

## 2020-04-08 LAB — POCT RAPID STREP A (OFFICE): Rapid Strep A Screen: NEGATIVE

## 2020-04-08 NOTE — ED Triage Notes (Signed)
Sore throat x 4 days, slight congestion

## 2020-04-08 NOTE — ED Provider Notes (Signed)
[facility]    CSN: [id number] Arrival date & time: 04/08/20  0805      History   Chief Complaint Chief Complaint  Patient presents with  . Sore Throat    HPI Mike Fox is a 24 y.o. male.   HPI  Mike Fox is a 24 y.o. male presenting to UC with c/o sore throat that started 4 days ago, associated mild nasal congestion and dry cough. Denies fever, chills, n/v/d. No body aches or HA. His young son was sick about 1 week ago. Pt shares custody of his son and had him last weekend, he does not believe he had strep throat. No other known sick contacts. Pt is not concerned for Covid.   History reviewed. No pertinent past medical history.  There are no problems to display for this patient.   History reviewed. No pertinent surgical history.     Home Medications    Prior to Admission medications   Not on File    Family History Family History  Problem Relation Age of Onset  . Healthy Mother   . Healthy Father     Social History Social History   Tobacco Use  . Smoking status: Current Every Day Smoker  . Smokeless tobacco: Never Used  Substance Use Topics  . Alcohol use: Yes  . Drug use: Not Currently     Allergies   Patient has no known allergies.   Review of Systems Review of Systems  Constitutional: Negative for chills and fever.  HENT: Positive for congestion and sore throat. Negative for ear pain, trouble swallowing and voice change.   Respiratory: Positive for cough. Negative for shortness of breath.   Cardiovascular: Negative for chest pain and palpitations.  Gastrointestinal: Negative for abdominal pain, diarrhea, nausea and vomiting.  Musculoskeletal: Negative for arthralgias, back pain and myalgias.  Skin: Negative for rash.  Neurological: Negative for dizziness, light-headedness and headaches.  All other systems reviewed and are negative.    Physical Exam Triage Vital Signs ED Triage Vitals  Enc Vitals Group     BP  04/08/20 0817 121/81     Pulse Rate 04/08/20 0817 70     Resp --      Temp 04/08/20 0817 98.4 F (36.9 C)     Temp Source 04/08/20 0817 Oral     SpO2 04/08/20 0817 98 %     Weight 04/08/20 0818 268 lb (121.6 kg)     Height 04/08/20 0818 6\' 3"  (1.905 m)     Head Circumference --      Peak Flow --      Pain Score 04/08/20 0818 5     Pain Loc --      Pain Edu? --      Excl. in [place]? --    No data found.  Updated Vital Signs BP 121/81 (BP Location: Right Arm)   Pulse 70   Temp 98.4 F (36.9 C) (Oral)   Ht 6\' 3"  (1.905 m)   Wt 268 lb (121.6 kg)   SpO2 98%   BMI 33.50 kg/m   Visual Acuity Right Eye Distance:   Left Eye Distance:   Bilateral Distance:    Right Eye Near:   Left Eye Near:    Bilateral Near:     Physical Exam Vitals and nursing note reviewed.  Constitutional:      Appearance: He is well-developed.  HENT:     Head: Normocephalic and atraumatic.     Right Ear: Tympanic membrane and  ear canal normal.     Left Ear: Tympanic membrane and ear canal normal.     Nose: Nose normal.     Right Sinus: No maxillary sinus tenderness or frontal sinus tenderness.     Left Sinus: No maxillary sinus tenderness or frontal sinus tenderness.     Mouth/Throat:     Lips: Pink.     Mouth: Mucous membranes are moist.     Pharynx: Oropharynx is clear. Uvula midline. Posterior oropharyngeal erythema present. No pharyngeal swelling, oropharyngeal exudate or uvula swelling.     Tonsils: No tonsillar exudate or tonsillar abscesses. 3+ on the right. 3+ on the left.  Cardiovascular:     Rate and Rhythm: Normal rate and regular rhythm.  Pulmonary:     Effort: Pulmonary effort is normal. No respiratory distress.     Breath sounds: Normal breath sounds. No stridor. No wheezing, rhonchi or rales.  Musculoskeletal:        General: Normal range of motion.     Cervical back: Normal range of motion and neck supple.  Lymphadenopathy:     Cervical: No cervical adenopathy.  Skin:     General: Skin is warm and dry.  Neurological:     Mental Status: He is alert and oriented to person, place, and time.  Psychiatric:        Behavior: Behavior normal.      UC Treatments / Results  Labs (all labs ordered are listed, but only abnormal results are displayed) Labs Reviewed  STREP A DNA PROBE  POCT RAPID STREP A (OFFICE)    EKG   Radiology No results found.  Procedures Procedures (including critical care time)  Medications Ordered in UC Medications - No data to display  Initial Impression / Assessment and Plan / UC Course  I have reviewed the triage vital signs and the nursing notes.  Pertinent labs & imaging results that were available during my care of the patient were reviewed by me and considered in my medical decision making (see chart for details).     Rapid strep: NEGATIVE Culture sent Encouraged symptomatic tx AVS provided  Final Clinical Impressions(s) / UC Diagnoses   Final diagnoses:  Acute tonsillitis, unspecified etiology     Discharge Instructions      You may take 500mg  acetaminophen every 4-6 hours or in combination with ibuprofen 400-600mg  every 6-8 hours as needed for pain, inflammation, and fever. You can also swish and gargle warm salt water to help with throat irritation.   Be sure to well hydrated with clear liquids (warm liquids can help sooth your throat- teas, soups) and get at least 8 hours of sleep at night, preferably more while sick.   Please follow up with family medicine in 1 week if needed.     ED Prescriptions    None     PDMP not reviewed this encounter.   Noe Gens, [place] 04/08/20 [phone]

## 2020-04-08 NOTE — Discharge Instructions (Signed)
  You may take 500mg  acetaminophen every 4-6 hours or in combination with ibuprofen 400-600mg  every 6-8 hours as needed for pain, inflammation, and fever. You can also swish and gargle warm salt water to help with throat irritation.   Be sure to well hydrated with clear liquids (warm liquids can help sooth your throat- teas, soups) and get at least 8 hours of sleep at night, preferably more while sick.   Please follow up with family medicine in 1 week if needed.

## 2020-04-09 LAB — STREP A DNA PROBE: Group A Strep Probe: NOT DETECTED

## 2020-07-04 ENCOUNTER — Other Ambulatory Visit: Payer: Self-pay

## 2021-02-26 IMAGING — DX DG CHEST 2V
2 series · 2 of 2 positions shown · non-contrast
Comparison: None.

CLINICAL DATA: Chest pain over the last week.  Smoking history.

EXAM:
CHEST - 2 VIEW

[chest pa]
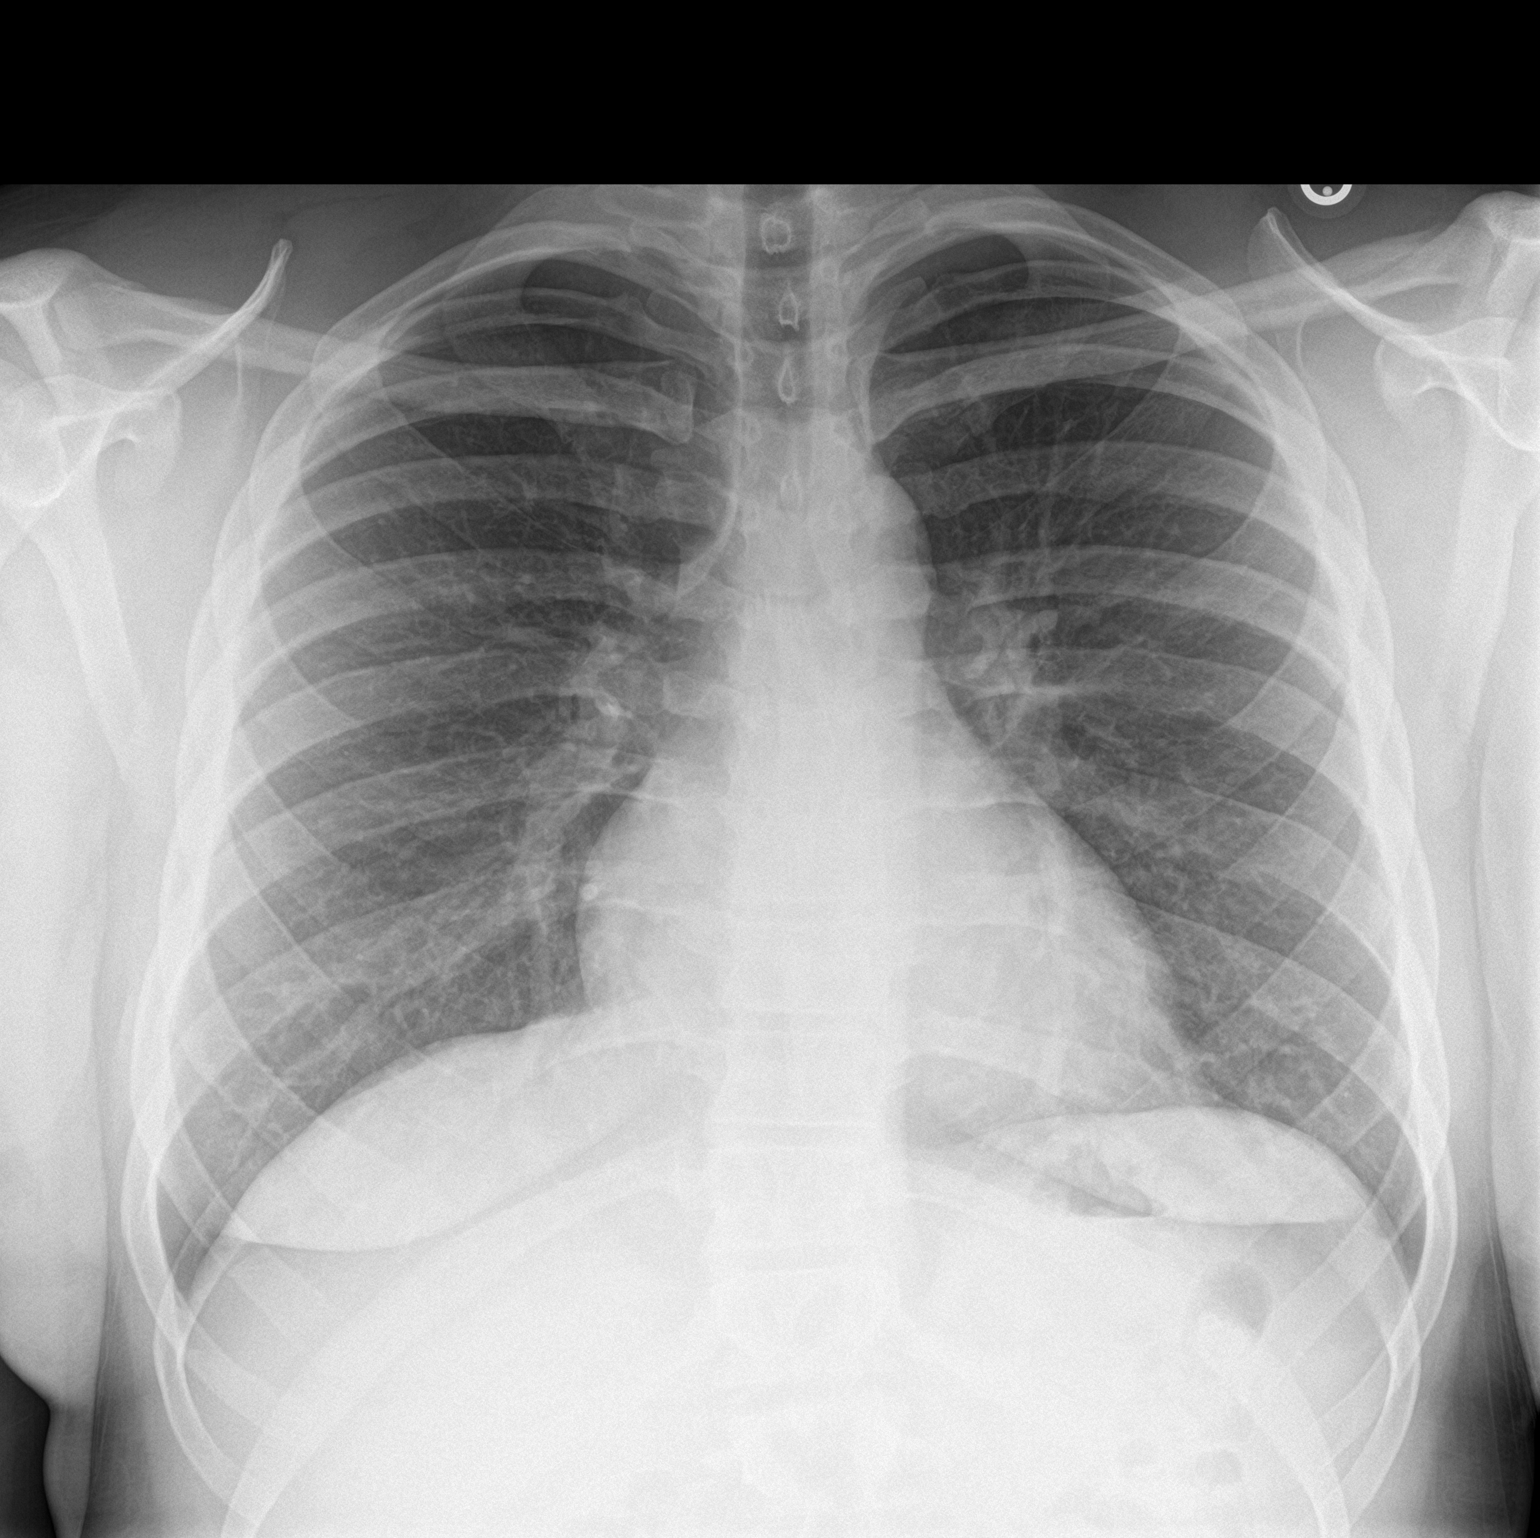

[chest lat]
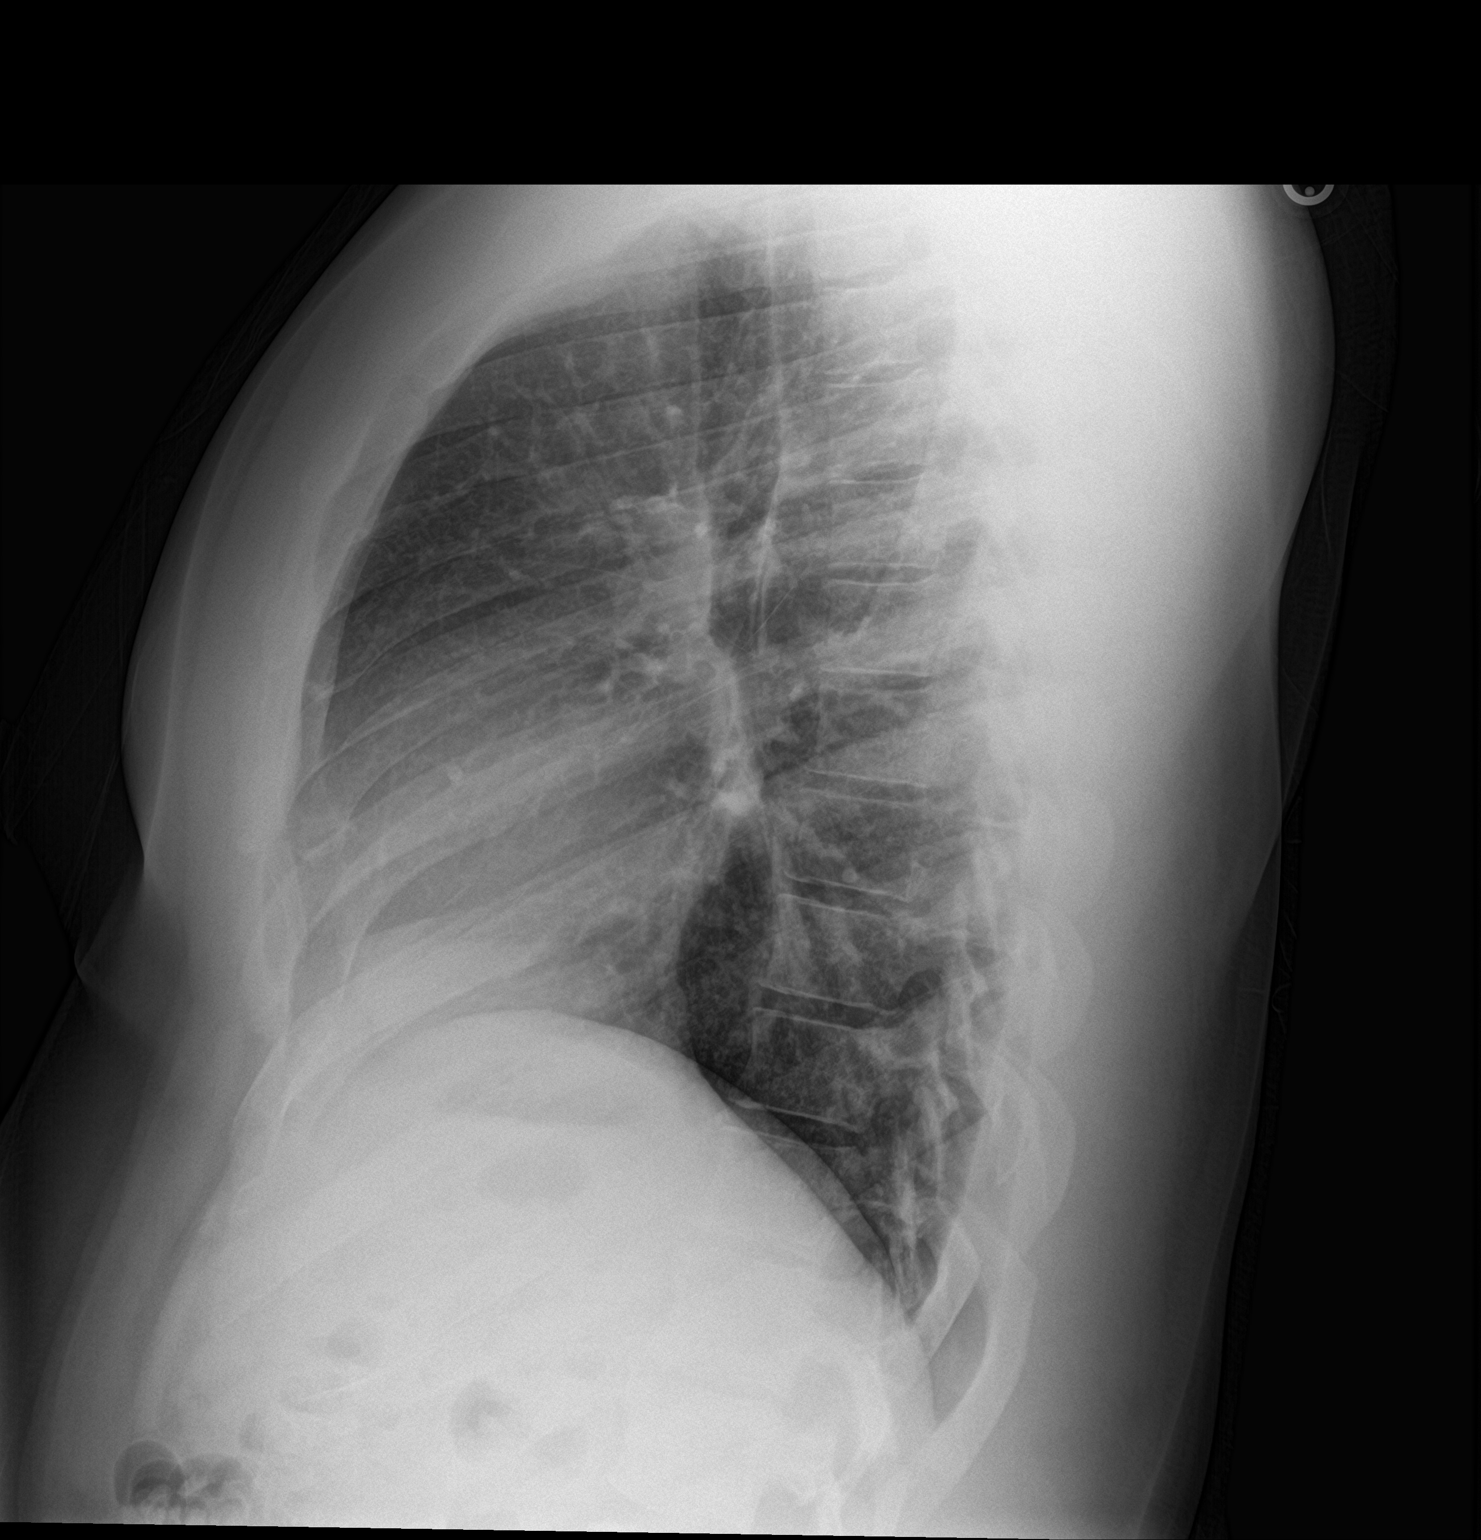

[2 of 2 positions shown; findings below may reference images not displayed]

FINDINGS: Heart size is normal. Mediastinal shadows are normal. The lungs are
clear. No bronchial thickening. No infiltrate, mass, effusion or
collapse. Pulmonary vascularity is normal. No bony abnormality.
IMPRESSION: Normal chest
